# Patient Record
Sex: Female | Born: 1980 | Race: White | Hispanic: No | Marital: Single | Smoking: Current every day smoker
Health system: Southern US, Community
[De-identification: ages and names within clinical notes are randomized; demographics above are authoritative.]

## PROBLEM LIST (undated history)

## (undated) HISTORY — PX: TUBAL LIGATION: SHX77

---

## 2014-04-03 ENCOUNTER — Encounter (HOSPITAL_COMMUNITY): Payer: Self-pay | Admitting: Emergency Medicine

## 2014-04-03 ENCOUNTER — Emergency Department (HOSPITAL_COMMUNITY)
Admission: EM | Admit: 2014-04-03 | Discharge: 2014-04-03 | Discharge status: Against Medical Advice | Payer: Medicaid Other | Attending: Emergency Medicine | Admitting: Emergency Medicine

## 2014-04-03 DIAGNOSIS — Y929 Unspecified place or not applicable: Secondary | ICD-10-CM | POA: Insufficient documentation

## 2014-04-03 DIAGNOSIS — W57XXXA Bitten or stung by nonvenomous insect and other nonvenomous arthropods, initial encounter: Principal | ICD-10-CM

## 2014-04-03 DIAGNOSIS — Y939 Activity, unspecified: Secondary | ICD-10-CM | POA: Insufficient documentation

## 2014-04-03 DIAGNOSIS — F17200 Nicotine dependence, unspecified, uncomplicated: Secondary | ICD-10-CM | POA: Insufficient documentation

## 2014-04-03 DIAGNOSIS — S90569A Insect bite (nonvenomous), unspecified ankle, initial encounter: Secondary | ICD-10-CM | POA: Insufficient documentation

## 2014-04-03 NOTE — ED Notes (Signed)
Patient left after triage prior to being seen by EDP. Informed registration that she was leaving.

## 2014-04-03 NOTE — ED Notes (Signed)
Patient reports was stung by bee to left hip on Sunday. Reports area is red, warm to touch, swollen, and tender.

## 2014-08-15 ENCOUNTER — Encounter (HOSPITAL_COMMUNITY): Payer: Self-pay | Admitting: Emergency Medicine

## 2014-08-15 ENCOUNTER — Emergency Department (HOSPITAL_COMMUNITY): Payer: Medicaid Other

## 2014-08-15 ENCOUNTER — Emergency Department (HOSPITAL_COMMUNITY)
Admission: EM | Admit: 2014-08-15 | Discharge: 2014-08-15 | Disposition: A | Discharge status: Home / Self Care | Payer: Medicaid Other | Attending: Emergency Medicine | Admitting: Emergency Medicine

## 2014-08-15 DIAGNOSIS — S99919A Unspecified injury of unspecified ankle, initial encounter: Secondary | ICD-10-CM

## 2014-08-15 DIAGNOSIS — S93609A Unspecified sprain of unspecified foot, initial encounter: Secondary | ICD-10-CM | POA: Diagnosis not present

## 2014-08-15 DIAGNOSIS — F17200 Nicotine dependence, unspecified, uncomplicated: Secondary | ICD-10-CM | POA: Diagnosis not present

## 2014-08-15 DIAGNOSIS — R296 Repeated falls: Secondary | ICD-10-CM | POA: Insufficient documentation

## 2014-08-15 DIAGNOSIS — S99929A Unspecified injury of unspecified foot, initial encounter: Secondary | ICD-10-CM

## 2014-08-15 DIAGNOSIS — Y939 Activity, unspecified: Secondary | ICD-10-CM | POA: Insufficient documentation

## 2014-08-15 DIAGNOSIS — S8990XA Unspecified injury of unspecified lower leg, initial encounter: Secondary | ICD-10-CM | POA: Insufficient documentation

## 2014-08-15 DIAGNOSIS — Y929 Unspecified place or not applicable: Secondary | ICD-10-CM | POA: Diagnosis not present

## 2014-08-15 DIAGNOSIS — S96912A Strain of unspecified muscle and tendon at ankle and foot level, left foot, initial encounter: Secondary | ICD-10-CM

## 2014-08-15 MED ORDER — HYDROCODONE-ACETAMINOPHEN 5-325 MG PO TABS
1.0000 | ORAL_TABLET | Freq: Once | ORAL | Status: AC
  Administered 2014-08-15: 1 via ORAL
  Filled 2014-08-15: qty 1

## 2014-08-15 MED ORDER — HYDROCODONE-ACETAMINOPHEN 5-325 MG PO TABS: 1.0000 | ORAL_TABLET | ORAL | Status: AC | PRN

## 2014-08-15 NOTE — ED Provider Notes (Signed)
CSN: [id number]     Arrival date & time 08/15/14  1149 History   First MD Initiated Contact with Patient 08/15/14 1303     Chief Complaint  Patient presents with  . Toe Pain     (Consider location/radiation/quality/duration/timing/severity/associated sxs/prior Treatment) Patient is a 33 y.o. female presenting with toe pain. The history is provided by the patient. No language interpreter was used.  Toe Pain This is a new problem. The current episode started yesterday. Pertinent negatives include no fever or numbness. Associated symptoms comments: She has left great toe pain after injury last night including fall onto the toe. No other injury.Marland Kitchen    History reviewed. No pertinent past medical history. Past Surgical History  Procedure Laterality Date  . Tubal ligation     History reviewed. No pertinent family history. History  Substance Use Topics  . Smoking status: Current Every Day Smoker  . Smokeless tobacco: Not on file  . Alcohol Use: No   OB History   Grav Para Term Preterm Abortions TAB SAB Ect Mult Living                 Review of Systems  Constitutional: Negative for fever.  Musculoskeletal:       See HPI.  Skin: Negative for wound.  Neurological: Negative for numbness.      Allergies  Morphine and related  Home Medications   Prior to Admission medications   Not on File   BP 121/71  Pulse 62  Temp(Src) 98.2 F (36.8 C) (Oral)  Resp 14  Ht  (1.676 m)  Wt 165 lb (74.844 kg)  BMI 26.64 kg/m2  SpO2 100%  LMP 08/13/2014 Physical Exam  Constitutional: She is oriented to person, place, and time. She appears well-developed and well-nourished.  Neck: Normal range of motion.  Pulmonary/Chest: Effort normal.  Musculoskeletal:  Left great toe appears without swelling or discoloration. No bony deformity. Tender. No forefoot tenderness. Tendon function of great toe intact.   Neurological: She is alert and oriented to person, place, and time.  Skin: Skin  is warm and dry.    ED Course  Procedures (including critical care time) Labs Review Labs Reviewed - No data to display  Imaging Review Dg Toe Great Left  08/15/2014   CLINICAL DATA:  Left great toe pain  EXAM: LEFT GREAT TOE  COMPARISON:  None.  FINDINGS: Three views of the left great toe submitted. No acute fracture or subluxation. No radiopaque foreign body.  IMPRESSION: Negative.   Electronically Signed   By: Natasha Mead M.D.   On: 08/15/2014 12:52     EKG Interpretation None      MDM   Final diagnoses:  None    1. Great toe strain  Negative imaging. Pain addressed, buddy taping provided.     Arnoldo Hooker, PA-C 08/15/14 1412

## 2014-08-15 NOTE — ED Notes (Signed)
Pt reports pain and swelling to her left great toe with bruising. Reports she hurt it last night when her toe "got stuck in my jeans"

## 2014-08-15 NOTE — Discharge Instructions (Signed)
Cryotherapy °Cryotherapy means treatment with cold. Ice or gel packs can be used to reduce both pain and swelling. Ice is the most helpful within the first 24 to 48 hours after an injury or flare-up from overusing a muscle or joint. Sprains, strains, spasms, burning pain, shooting pain, and aches can all be eased with ice. Ice can also be used when recovering from surgery. Ice is effective, has very few side effects, and is safe for most people to use. °PRECAUTIONS  °Ice is not a safe treatment option for people with: °· Raynaud phenomenon. This is a condition affecting small blood vessels in the extremities. Exposure to cold may cause your problems to return. °· Cold hypersensitivity. There are many forms of cold hypersensitivity, including: °¨ Cold urticaria. Red, itchy hives appear on the skin when the tissues begin to warm after being iced. °¨ Cold erythema. This is a red, itchy rash caused by exposure to cold. °¨ Cold hemoglobinuria. Red blood cells break down when the tissues begin to warm after being iced. The hemoglobin that carry oxygen are passed into the urine because they cannot combine with blood proteins fast enough. °· Numbness or altered sensitivity in the area being iced. °If you have any of the following conditions, do not use ice until you have discussed cryotherapy with your caregiver: °· Heart conditions, such as arrhythmia, angina, or chronic heart disease. °· High blood pressure. °· Healing wounds or open skin in the area being iced. °· Current infections. °· Rheumatoid arthritis. °· Poor circulation. °· Diabetes. °Ice slows the blood flow in the region it is applied. This is beneficial when trying to stop inflamed tissues from spreading irritating chemicals to surrounding tissues. However, if you expose your skin to cold temperatures for too long or without the proper protection, you can damage your skin or nerves. Watch for signs of skin damage due to cold. °HOME CARE INSTRUCTIONS °Follow  these tips to use ice and cold packs safely. °· Place a dry or damp towel between the ice and skin. A damp towel will cool the skin more quickly, so you may need to shorten the time that the ice is used. °· For a more rapid response, add gentle compression to the ice. °· Ice for no more than 10 to 20 minutes at a time. The bonier the area you are icing, the less time it will take to get the benefits of ice. °· Check your skin after 5 minutes to make sure there are no signs of a poor response to cold or skin damage. °· Rest 20 minutes or more between uses. °· Once your skin is numb, you can end your treatment. You can test numbness by very lightly touching your skin. The touch should be so light that you do not see the skin dimple from the pressure of your fingertip. When using ice, most people will feel these normal sensations in this order: cold, burning, aching, and numbness. °· Do not use ice on someone who cannot communicate their responses to pain, such as small children or people with dementia. °HOW TO MAKE AN ICE PACK °Ice packs are the most common way to use ice therapy. Other methods include ice massage, ice baths, and cryosprays. Muscle creams that cause a cold, tingly feeling do not offer the same benefits that ice offers and should not be used as a substitute unless recommended by your caregiver. °To make an ice pack, do one of the following: °· Place crushed ice or a   bag of frozen vegetables in a sealable plastic bag. Squeeze out the excess air. Place this bag inside another plastic bag. Slide the bag into a pillowcase or place a damp towel between your skin and the bag. °· Mix 3 parts water with 1 part rubbing alcohol. Freeze the mixture in a sealable plastic bag. When you remove the mixture from the freezer, it will be slushy. Squeeze out the excess air. Place this bag inside another plastic bag. Slide the bag into a pillowcase or place a damp towel between your skin and the bag. °SEEK MEDICAL CARE  IF: °· You develop white spots on your skin. This may give the skin a blotchy (mottled) appearance. °· Your skin turns blue or pale. °· Your skin becomes waxy or hard. °· Your swelling gets worse. °MAKE SURE YOU:  °· Understand these instructions. °· Will watch your condition. °· Will get help right away if you are not doing well or get worse. °Document Released: 07/06/2011 Document Revised: 03/26/2014 Document Reviewed: 07/06/2011 °[employer]® Patient Information ©2015 [employer]. This information is not intended to replace advice given to you by your health care provider. Make sure you discuss any questions you have with your health care provider. ° °

## 2014-08-15 NOTE — ED Provider Notes (Signed)
Medical screening examination/treatment/procedure(s) were performed by non-physician practitioner and as supervising physician I was immediately available for consultation/collaboration.   EKG Interpretation None        Colbie Sliker L Maram Bently, MD 08/15/14 1550 

## 2015-09-17 IMAGING — CR DG TOE GREAT 2+V*L*
1 series · 1 of 1 positions shown · non-contrast
Comparison: None.

CLINICAL DATA: Left great toe pain

EXAM:
LEFT GREAT TOE

[view not recorded]
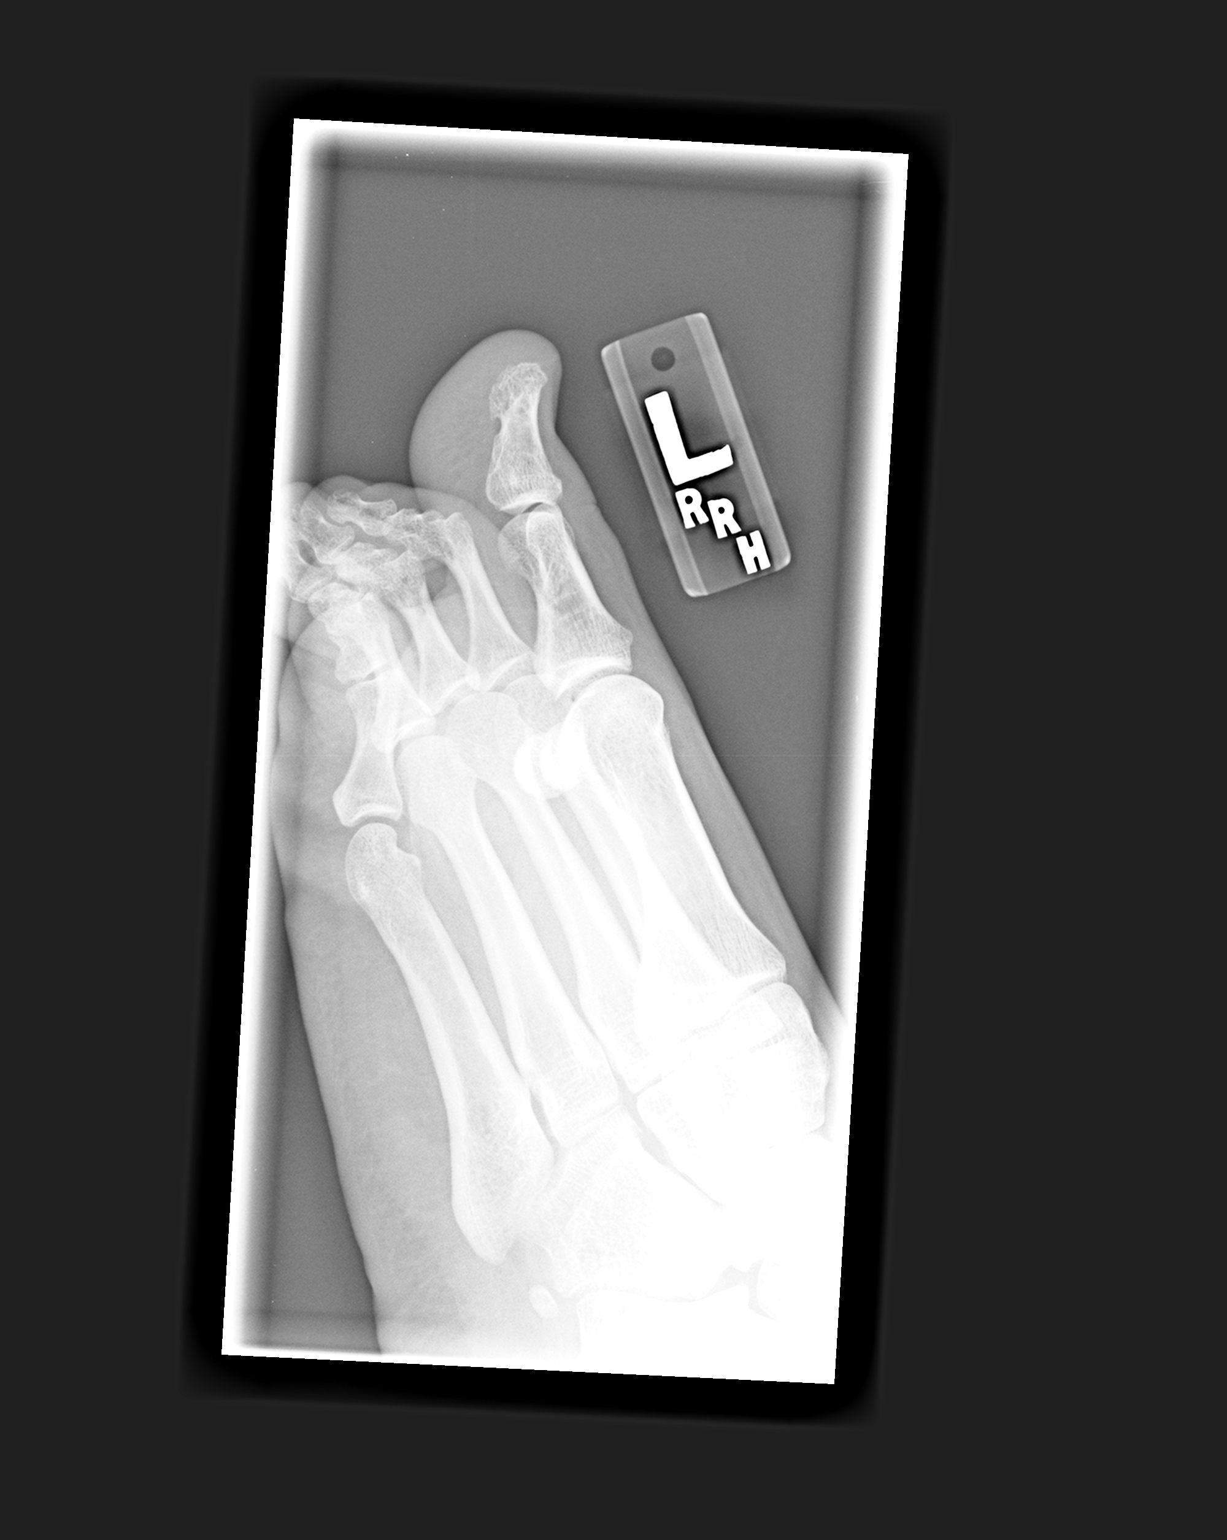

[1 of 1 positions shown; findings below may reference images not displayed]

FINDINGS: Three views of the left great toe submitted. No acute fracture or
subluxation. No radiopaque foreign body.
IMPRESSION: Negative.
